# Patient Record
Sex: Female | Born: 1975 | Race: White | Hispanic: No | Marital: Married | Smoking: Current every day smoker
Health system: Southern US, Community
[De-identification: ages and names within clinical notes are randomized; demographics above are authoritative.]

## PROBLEM LIST (undated history)

## (undated) DIAGNOSIS — F32A Depression, unspecified: Secondary | ICD-10-CM

## (undated) HISTORY — PX: CHOLECYSTECTOMY: SHX55

## (undated) HISTORY — PX: HEMORRHOID SURGERY: SHX153

---

## 2020-02-22 ENCOUNTER — Emergency Department (HOSPITAL_BASED_OUTPATIENT_CLINIC_OR_DEPARTMENT_OTHER): Payer: BC Managed Care – PPO

## 2020-02-22 ENCOUNTER — Encounter (HOSPITAL_BASED_OUTPATIENT_CLINIC_OR_DEPARTMENT_OTHER): Payer: Self-pay | Admitting: *Deleted

## 2020-02-22 ENCOUNTER — Other Ambulatory Visit: Payer: Self-pay

## 2020-02-22 DIAGNOSIS — E876 Hypokalemia: Secondary | ICD-10-CM | POA: Diagnosis not present

## 2020-02-22 DIAGNOSIS — R600 Localized edema: Secondary | ICD-10-CM | POA: Diagnosis present

## 2020-02-22 DIAGNOSIS — F17200 Nicotine dependence, unspecified, uncomplicated: Secondary | ICD-10-CM | POA: Insufficient documentation

## 2020-02-22 NOTE — ED Triage Notes (Signed)
Both of her legs are swelling. PVC's. No pain. No SOB.

## 2020-02-23 ENCOUNTER — Emergency Department (HOSPITAL_BASED_OUTPATIENT_CLINIC_OR_DEPARTMENT_OTHER)
Admission: RE | Admit: 2020-02-23 | Discharge: 2020-02-23 | Disposition: A | Discharge status: Home / Self Care | Payer: BC Managed Care – PPO | Source: Ambulatory Visit | Attending: Emergency Medicine | Admitting: Emergency Medicine

## 2020-02-23 ENCOUNTER — Emergency Department (HOSPITAL_BASED_OUTPATIENT_CLINIC_OR_DEPARTMENT_OTHER)
Admission: EM | Admit: 2020-02-23 | Discharge: 2020-02-23 | Disposition: A | Discharge status: Home / Self Care | Payer: BC Managed Care – PPO | Attending: Emergency Medicine | Admitting: Emergency Medicine

## 2020-02-23 ENCOUNTER — Emergency Department (HOSPITAL_BASED_OUTPATIENT_CLINIC_OR_DEPARTMENT_OTHER): Payer: BC Managed Care – PPO

## 2020-02-23 DIAGNOSIS — R600 Localized edema: Secondary | ICD-10-CM

## 2020-02-23 DIAGNOSIS — E876 Hypokalemia: Secondary | ICD-10-CM

## 2020-02-23 HISTORY — DX: Depression, unspecified: F32.A

## 2020-02-23 LAB — CBC WITH DIFFERENTIAL/PLATELET
Abs Immature Granulocytes: 0.03 10*3/uL (ref 0.00–0.07)
Basophils Absolute: 0.1 10*3/uL (ref 0.0–0.1)
Basophils Relative: 1 %
Eosinophils Absolute: 0.1 10*3/uL (ref 0.0–0.5)
Eosinophils Relative: 1 %
HCT: 37.9 % (ref 36.0–46.0)
Hemoglobin: 12.7 g/dL (ref 12.0–15.0)
Immature Granulocytes: 0 %
Lymphocytes Relative: 30 %
Lymphs Abs: 2.7 10*3/uL (ref 0.7–4.0)
MCH: 31.8 pg (ref 26.0–34.0)
MCHC: 33.5 g/dL (ref 30.0–36.0)
MCV: 95 fL (ref 80.0–100.0)
Monocytes Absolute: 0.7 10*3/uL (ref 0.1–1.0)
Monocytes Relative: 7 %
Neutro Abs: 5.6 10*3/uL (ref 1.7–7.7)
Neutrophils Relative %: 61 %
Platelets: 177 10*3/uL (ref 150–400)
RBC: 3.99 MIL/uL (ref 3.87–5.11)
RDW: 12.6 % (ref 11.5–15.5)
WBC: 9.1 10*3/uL (ref 4.0–10.5)
nRBC: 0 % (ref 0.0–0.2)

## 2020-02-23 LAB — COMPREHENSIVE METABOLIC PANEL
ALT: 9 U/L (ref 0–44)
AST: 12 U/L — ABNORMAL LOW (ref 15–41)
Albumin: 3.6 g/dL (ref 3.5–5.0)
Alkaline Phosphatase: 52 U/L (ref 38–126)
Anion gap: 8 (ref 5–15)
BUN: 9 mg/dL (ref 6–20)
CO2: 26 mmol/L (ref 22–32)
Calcium: 8.5 mg/dL — ABNORMAL LOW (ref 8.9–10.3)
Chloride: 105 mmol/L (ref 98–111)
Creatinine, Ser: 1.02 mg/dL — ABNORMAL HIGH (ref 0.44–1.00)
GFR calc Af Amer: >60 mL/min (ref >60)
GFR calc non Af Amer: >60 mL/min (ref >60)
Glucose, Bld: 98 mg/dL (ref 70–99)
Potassium: 3.4 mmol/L — ABNORMAL LOW (ref 3.5–5.1)
Sodium: 139 mmol/L (ref 135–145)
Total Bilirubin: 0.2 mg/dL — ABNORMAL LOW (ref 0.3–1.2)
Total Protein: 6.3 g/dL — ABNORMAL LOW (ref 6.5–8.1)

## 2020-02-23 NOTE — ED Provider Notes (Signed)
[facility] EMERGENCY DEPARTMENT Provider Note  CSN: [id number] Arrival date & time: 02/22/20 1959  Chief Complaint(s) Leg Swelling  HPI Andrea Khan is a 44 y.o. female   HPI  BLE edema 1 day. Patient noted the edema when she awoke this morning Improved sine this am on its own. Patient reports having a broken toe 5 weeks ago.   No recent travel or prolonged immobilization. No shortness of breath or chest pain. No recent fevers or infections. No OCPs. No prior history of DVT. No history of cardiac, liver, or renal disease.   Past Medical History Past Medical History:  Diagnosis Date  . Depression    There are no problems to display for this patient.  Home Medication(s) Prior to Admission medications   Not on File                                                                                                                                    Past Surgical History Past Surgical History:  Procedure Laterality Date  . CESAREAN SECTION    . CHOLECYSTECTOMY    . HEMORRHOID SURGERY     Family History No family history on file.  Social History Social History   Tobacco Use  . Smoking status: Current Every Day Smoker  . Smokeless tobacco: Never Used  Substance Use Topics  . Alcohol use: Not Currently  . Drug use: Never   Allergies Prednisone  Review of Systems Review of Systems All other systems are reviewed and are negative for acute change except as noted in the HPI  Physical Exam Vital Signs  I have reviewed the triage vital signs BP 102/69 (BP Location: Right Arm)   Pulse 75   Temp 98.5 F (36.9 C) (Oral)   Resp 17   Ht 5\' 5"  (1.651 m)   Wt 63.5 kg   LMP 02/19/2020   SpO2 100%   BMI 23.30 kg/m   Physical Exam Vitals reviewed.  Constitutional:      General: She is not in acute distress.    Appearance: She is well-developed. She is not diaphoretic.  HENT:     Head: Normocephalic and atraumatic.     Nose: Nose normal.  Eyes:      General: No scleral icterus.       Right eye: No discharge.        Left eye: No discharge.     Conjunctiva/sclera: Conjunctivae normal.     Pupils: Pupils are equal, round, and reactive to light.  Cardiovascular:     Rate and Rhythm: Normal rate and regular rhythm.     Heart sounds: No murmur heard.  No friction rub. No gallop.   Pulmonary:     Effort: Pulmonary effort is normal. No respiratory distress.     Breath sounds: Normal breath sounds. No stridor. No rales.  Abdominal:     General: There is no distension.  Palpations: Abdomen is soft.     Tenderness: There is no abdominal tenderness.  Musculoskeletal:        General: No tenderness.     Cervical back: Normal range of motion and neck supple.     Right lower leg: 1+ Pitting Edema present.     Left lower leg: 1+ Pitting Edema present.  Skin:    General: Skin is warm and dry.     Findings: No erythema or rash.  Neurological:     Mental Status: She is alert and oriented to person, place, and time.     ED Results and Treatments Labs (all labs ordered are listed, but only abnormal results are displayed) Labs Reviewed  COMPREHENSIVE METABOLIC PANEL - Abnormal; Notable for the following components:      Result Value   Potassium 3.4 (*)    Creatinine, Ser 1.02 (*)    Calcium 8.5 (*)    Total Protein 6.3 (*)    AST 12 (*)    Total Bilirubin 0.2 (*)    All other components within normal limits  CBC WITH DIFFERENTIAL/PLATELET                                                                                                                         EKG  EKG Interpretation  Date/Time:    Ventricular Rate:    PR Interval:    QRS Duration:   QT Interval:    QTC Calculation:   R Axis:     Text Interpretation:        Radiology DG Chest 2 View  Result Date: 02/22/2020 CLINICAL DATA:  Leg swelling EXAM: CHEST - 2 VIEW COMPARISON:  None. FINDINGS: The heart size and mediastinal contours are within normal limits.  Both lungs are clear. The visualized skeletal structures are unremarkable. IMPRESSION: Negative. Electronically Signed   By: Charlett Nose M.D.   On: 02/22/2020 20:37    Pertinent labs & imaging results that were available during my care of the patient were reviewed by me and considered in my medical decision making (see chart for details).  Medications Ordered in ED Medications - No data to display                                                                                                                                  Procedures Procedures  (including critical care time)  Medical Decision Making /  ED Course I have reviewed the nursing notes for this encounter and the patient's prior records (if available in EHR or on provided paperwork).   Andrea Khan was evaluated in Emergency Department on 02/23/2020 for the symptoms described in the history of present illness. She was evaluated in the context of the global COVID-19 pandemic, which necessitated consideration that the patient might be at risk for infection with the SARS-CoV-2 virus that causes COVID-19. Institutional protocols and algorithms that pertain to the evaluation of patients at risk for COVID-19 are in a state of rapid change based on information released by regulatory bodies including the CDC and federal and state organizations. These policies and algorithms were followed during the patient's care in the ED.  1 day of bilateral lower extremity edema has improved since earlier today. Screening labs obtained and show no evidence of anemia. Patient noted to have mild renal sufficiency with a creatinine of 1.01.  No prior for comparison. No evidence of liver failure. Mild hypokalemia and hypocalcemia noted. Chest x-ray without evidence of pulmonary edema or cardiomegaly -doubt heart failure.  Low suspicion for DVT the patient has not close follow-up.  Will order outpatient DVT study for the morning.  Instructed to  establish care and follow-up with primary care provider      Final Clinical Impression(s) / ED Diagnoses Final diagnoses:  Bilateral lower extremity edema  Hypokalemia  Hypocalcemia   The patient appears reasonably screened and/or stabilized for discharge and I doubt any other medical condition or other [facility] requiring further screening, evaluation, or treatment in the ED at this time prior to discharge. Safe for discharge with strict return precautions.  Disposition: Discharge  Condition: Good  I have discussed the results, Dx and Tx plan with the patient/family who expressed understanding and agree(s) with the plan. Discharge instructions discussed at length. The patient/family was given strict return precautions who verbalized understanding of the instructions. No further questions at time of discharge.    ED Discharge Orders         Ordered    US Venous Img Lower Bilateral     Discontinue     02/23/20 0249            Follow Up: Primary care provider  Schedule an appointment as soon as possible for a visit  If you do not have a primary care physician, contact [facility] at [phone] for referral      This chart was dictated using voice recognition software.  Despite best efforts to proofread,  errors can occur which can change the documentation meaning.   Nira Conn, MD 02/23/20 [phone]

## 2020-02-23 NOTE — ED Provider Notes (Signed)
Pt came back for an [place] of her legs to r/o DVT.  [place] neg for DVT.  Pt could not wait for results as she had to go to work.  I left a message on her cell per her request.   Jacalyn Lefevre, MD 02/23/20 1233

## 2020-02-23 NOTE — ED Notes (Signed)
Pt returned for [place].  Waiting in WR for 90 min post [place] for results but the result still is not in.  She has to go to work.  She asked if we would call her with the result at her cell phone - [phone].  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  She said it is fine to leave a detailed message on that phone.

## 2021-09-25 IMAGING — CR DG CHEST 2V
2 series · 2 of 2 positions shown · non-contrast
Comparison: None.

CLINICAL DATA: Leg swelling

EXAM:
CHEST - 2 VIEW

[w chest pa]
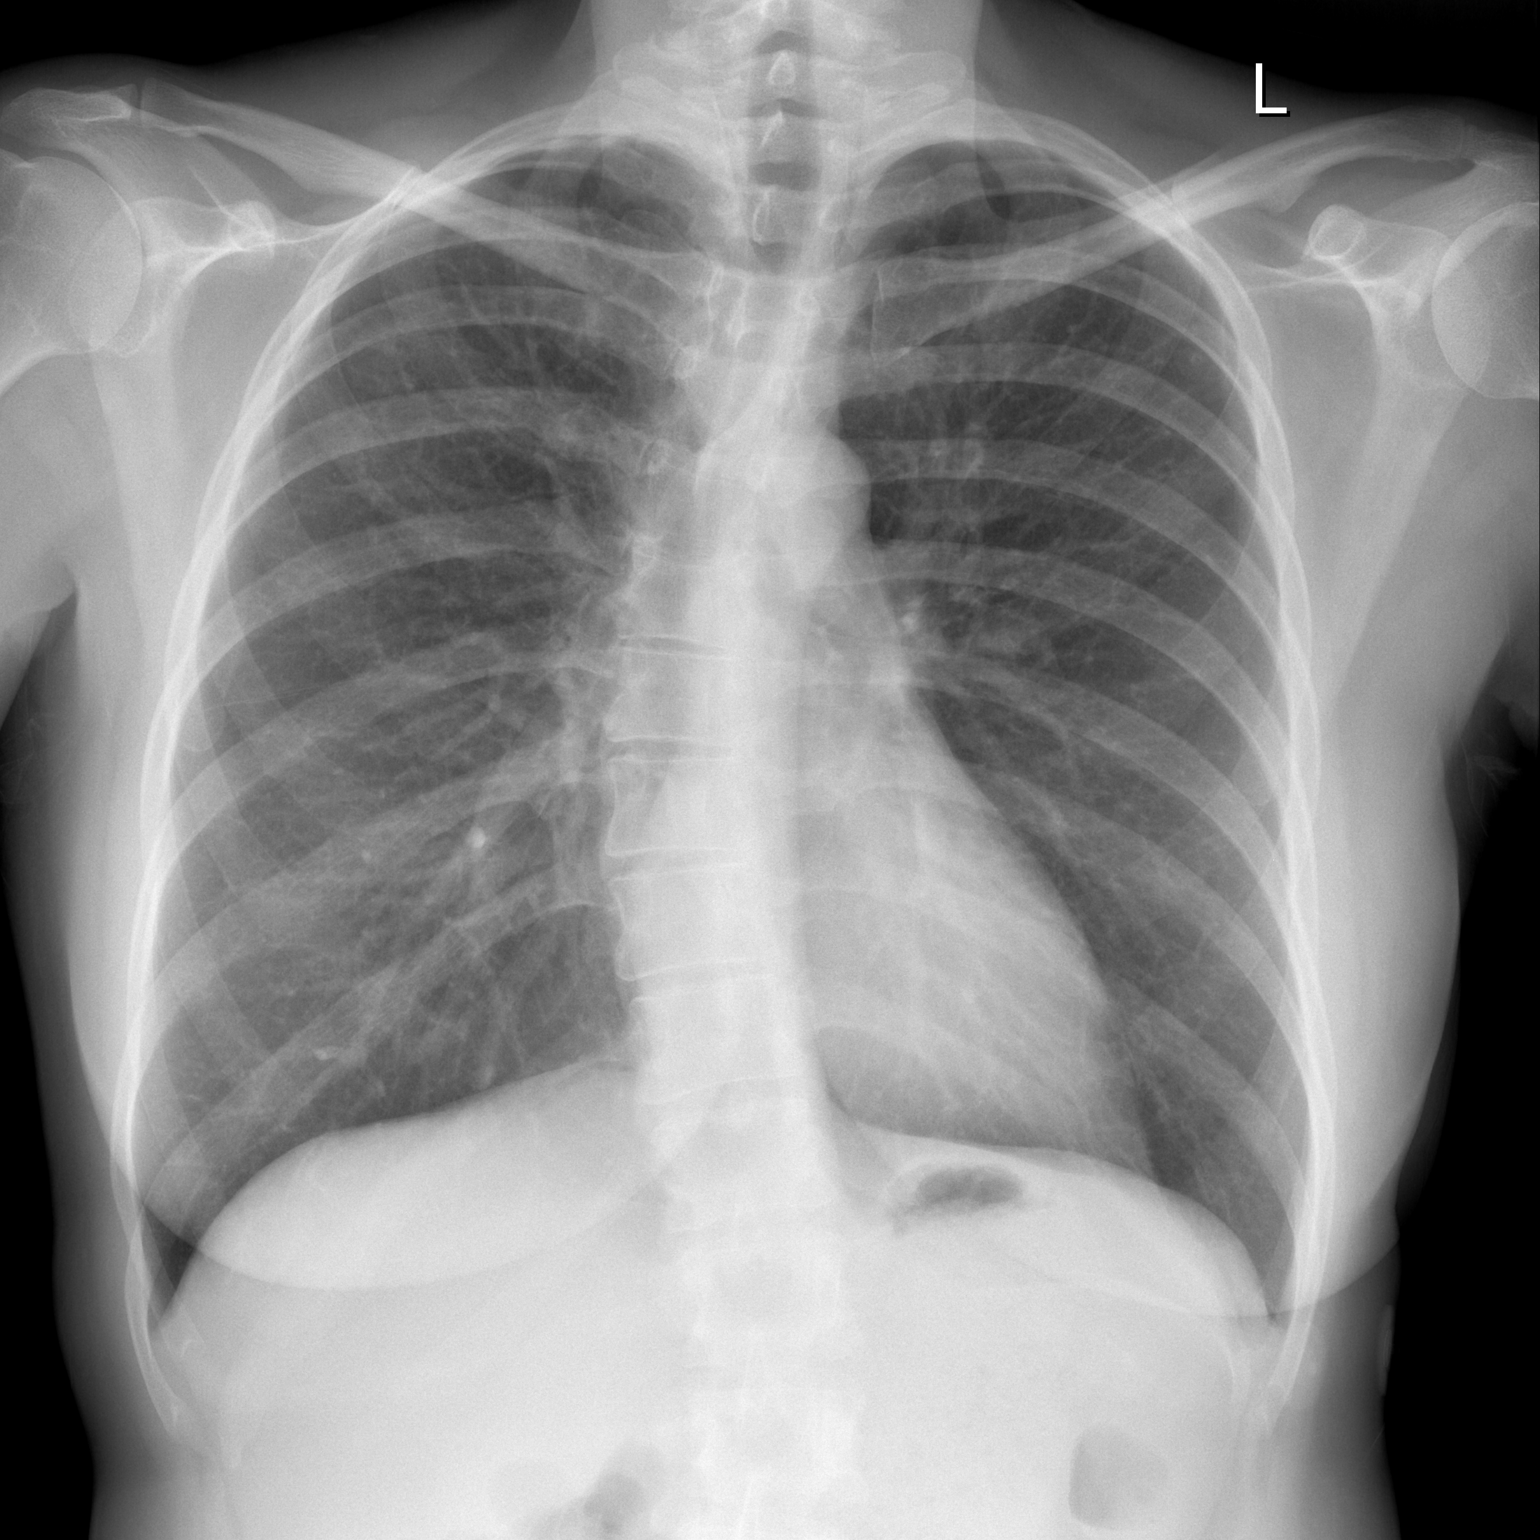

[w chest lat]
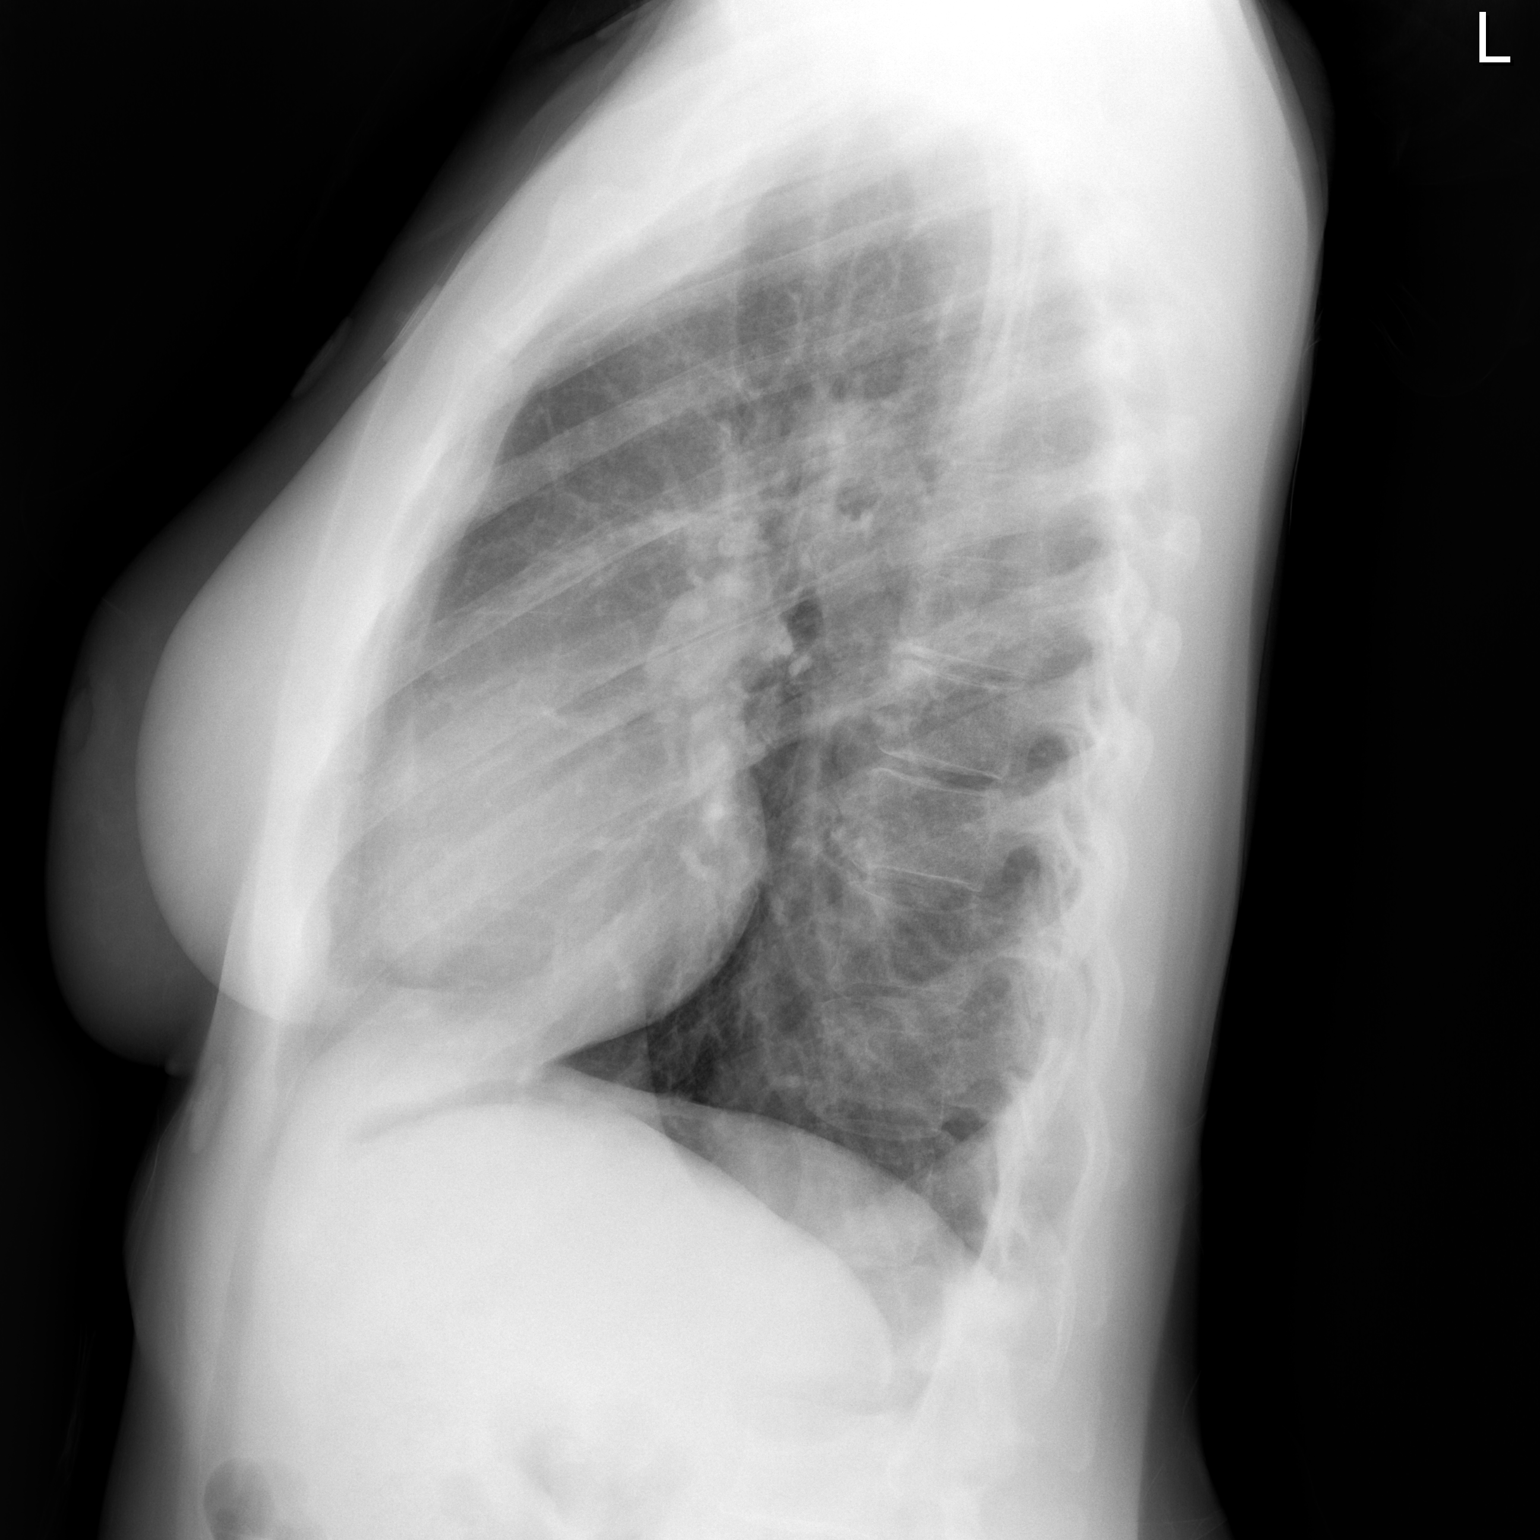

[2 of 2 positions shown; findings below may reference images not displayed]

FINDINGS: The heart size and mediastinal contours are within normal limits.
Both lungs are clear. The visualized skeletal structures are
unremarkable.
IMPRESSION: Negative.
